# Patient Record
Sex: Female | Born: 1960 | Race: Black or African American | Hispanic: No | Marital: Single | Smoking: Current some day smoker
Health system: Southern US, Community
[De-identification: ages and names within clinical notes are randomized; demographics above are authoritative.]

## PROBLEM LIST (undated history)

## (undated) ENCOUNTER — Ambulatory Visit: Admission: EM | Source: Home / Self Care

## (undated) HISTORY — PX: ABDOMINAL HYSTERECTOMY: SHX81

---

## 2001-01-15 ENCOUNTER — Other Ambulatory Visit: Admission: RE | Admit: 2001-01-15 | Discharge: 2001-01-15 | Payer: Self-pay | Admitting: Gynecology

## 2002-04-20 ENCOUNTER — Other Ambulatory Visit: Admission: RE | Admit: 2002-04-20 | Discharge: 2002-04-20 | Payer: Self-pay | Admitting: Gynecology

## 2003-08-12 ENCOUNTER — Other Ambulatory Visit: Admission: RE | Admit: 2003-08-12 | Discharge: 2003-08-12 | Payer: Self-pay | Admitting: Gynecology

## 2005-03-12 ENCOUNTER — Other Ambulatory Visit: Admission: RE | Admit: 2005-03-12 | Discharge: 2005-03-12 | Payer: Self-pay | Admitting: Gynecology

## 2006-08-19 ENCOUNTER — Other Ambulatory Visit: Admission: RE | Admit: 2006-08-19 | Discharge: 2006-08-19 | Payer: Self-pay | Admitting: Gynecology

## 2007-11-03 ENCOUNTER — Ambulatory Visit (HOSPITAL_COMMUNITY): Admission: RE | Admit: 2007-11-03 | Discharge: 2007-11-03 | Payer: Self-pay | Admitting: Obstetrics & Gynecology

## 2008-07-06 ENCOUNTER — Encounter: Payer: Self-pay | Admitting: Obstetrics & Gynecology

## 2008-07-06 ENCOUNTER — Inpatient Hospital Stay (HOSPITAL_COMMUNITY): Admission: AD | Admit: 2008-07-06 | Discharge: 2008-07-07 | Payer: Self-pay | Admitting: Obstetrics & Gynecology

## 2008-10-31 IMAGING — CT CT ABD-PELV W/O CM
2 of 4 series · 15 of 42 positions shown, 19 images · non-contrast
Comparison: NONE

CLINICAL DATA: Dysfunctional uterine bleeding. 

CT ABDOMEN AND PELVIS WITHOUT INTRAVENOUS BUT FOLLOWING ORAL 
CONTRAST
TECHNIQUE: Multiple axial images were obtained from the diaphragm 
through the pelvis.

[Series 2: wo · axial · 0.61mm/px · z∈[+1351,+1691]mm · 12 of 77 slices shown, 16 images]
[im 5/77  soft-tissue]
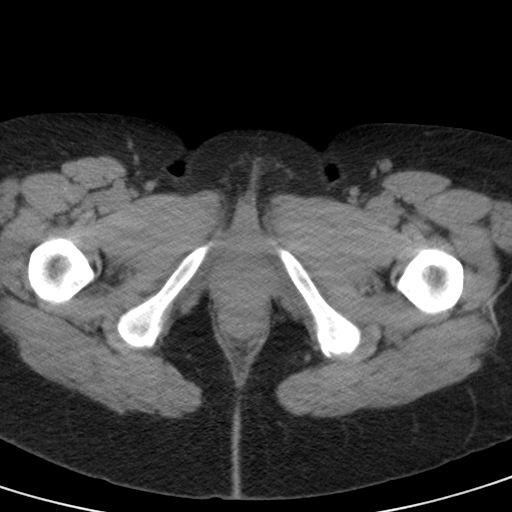
[im 5/77  bone]
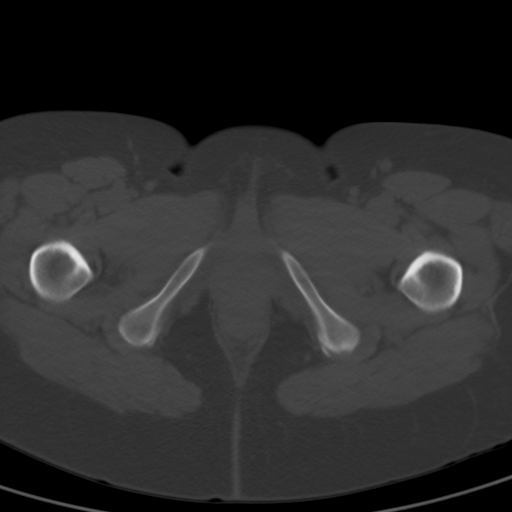
[im 13/77  soft-tissue]
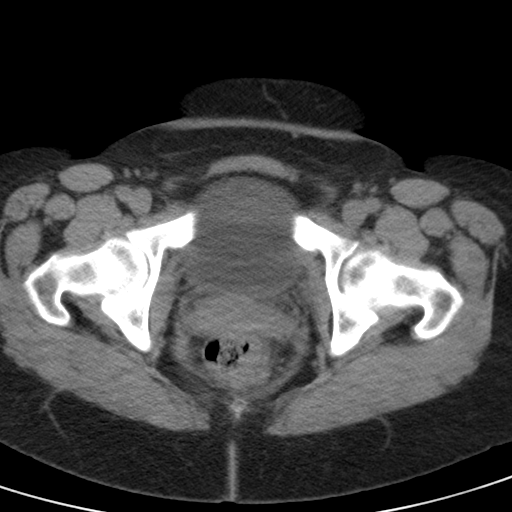
[im 21/77  soft-tissue]
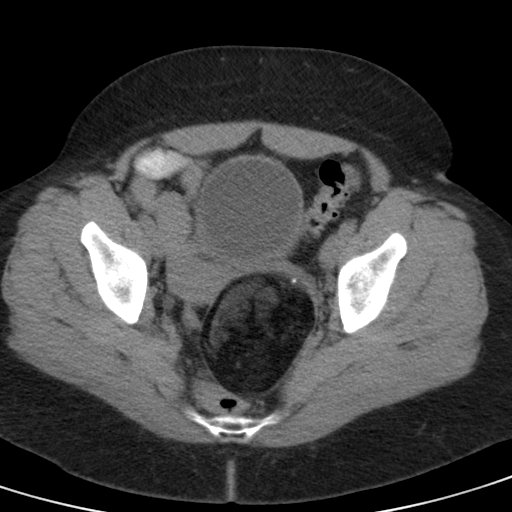
[im 29/77  soft-tissue]
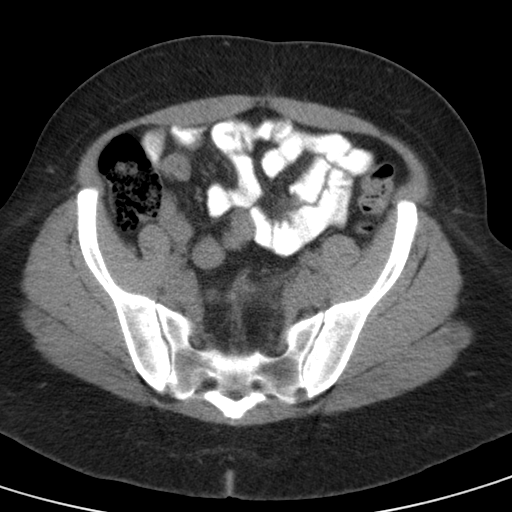
[im 37/77  soft-tissue]
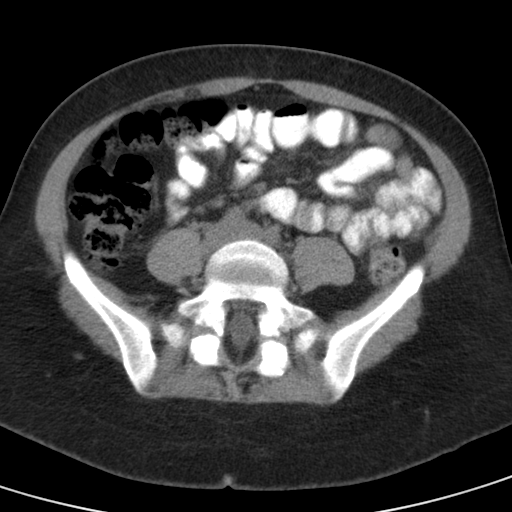
[im 41/77  soft-tissue]
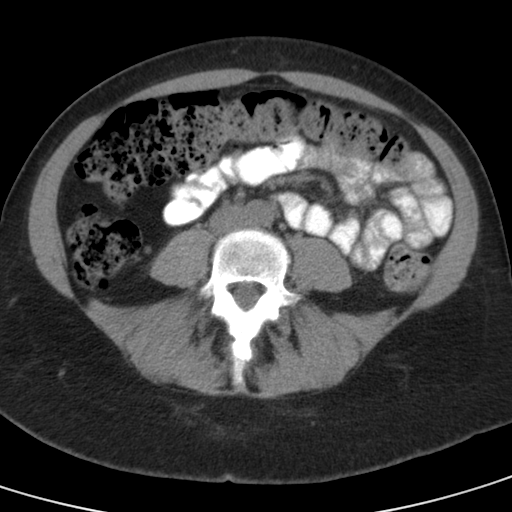
[im 49/77  soft-tissue]
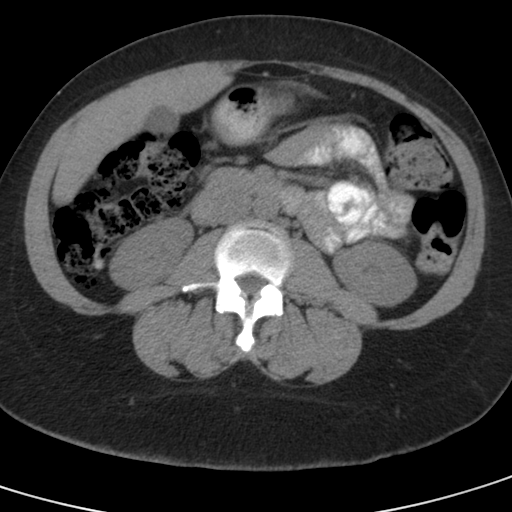
[im 57/77  soft-tissue]
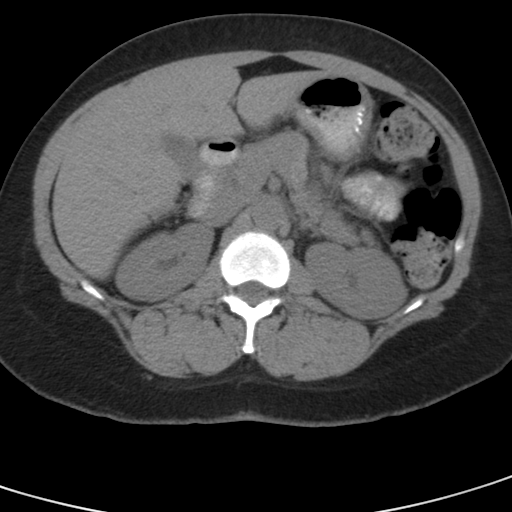
[im 61/77  lung]
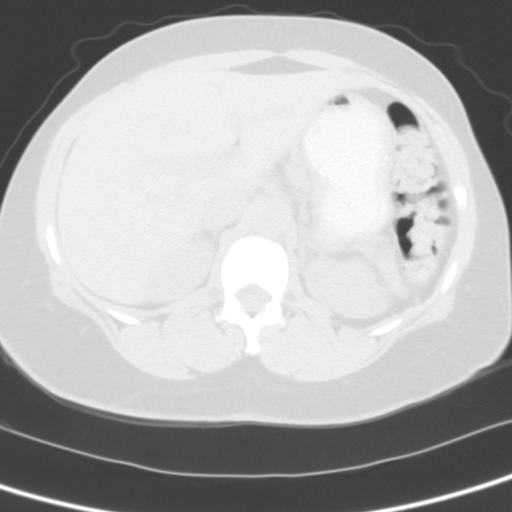
[im 65/77  soft-tissue]
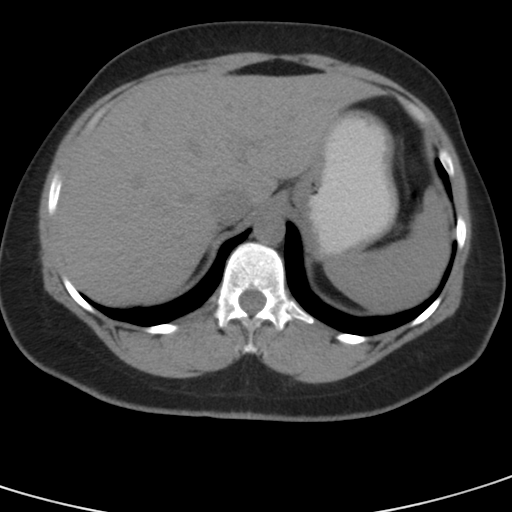
[im 65/77  lung]
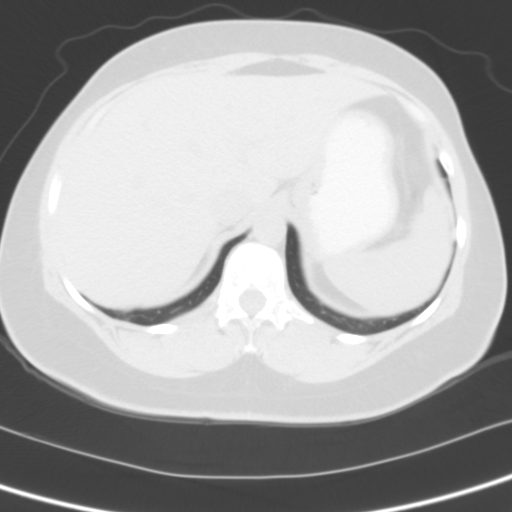
[im 65/77  bone]
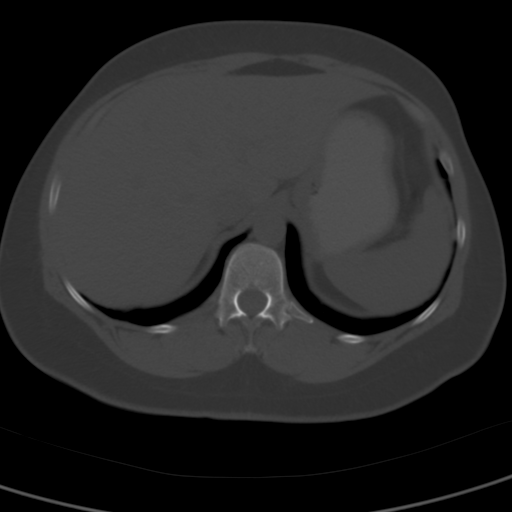
[im 69/77  lung]
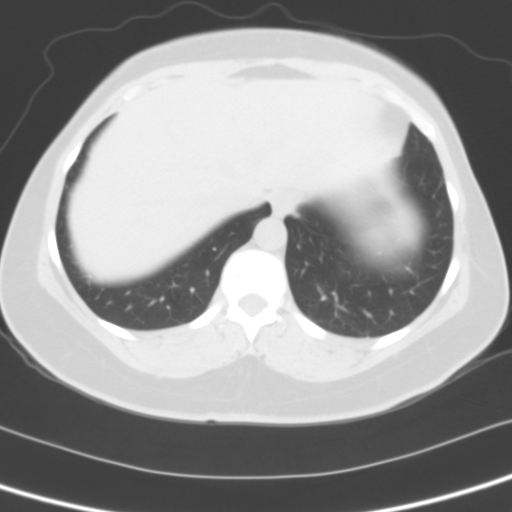
[im 73/77  soft-tissue]
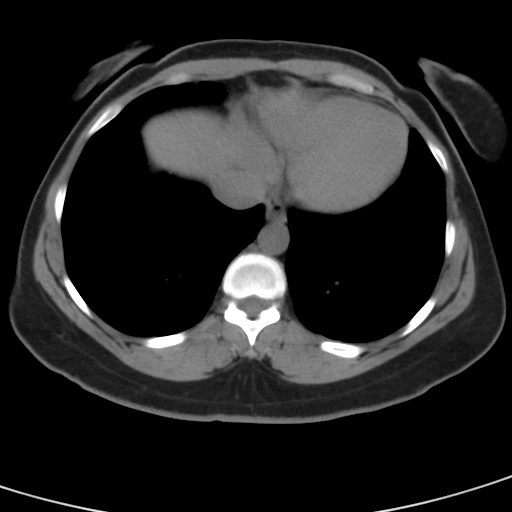
[im 73/77  lung]
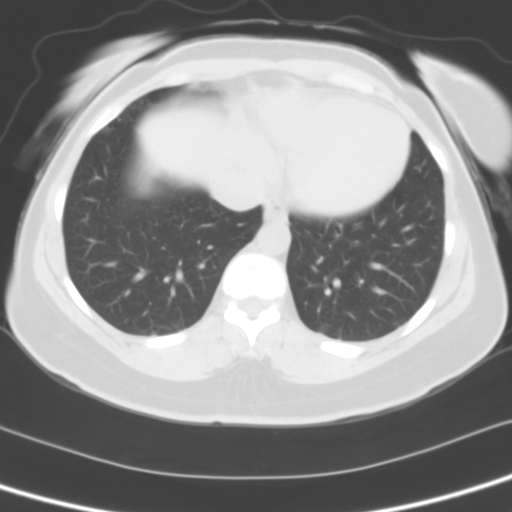

[coronals · coronal · 0.74mm/px · 3 of 75 slices shown]
[im 25/75  soft-tissue]
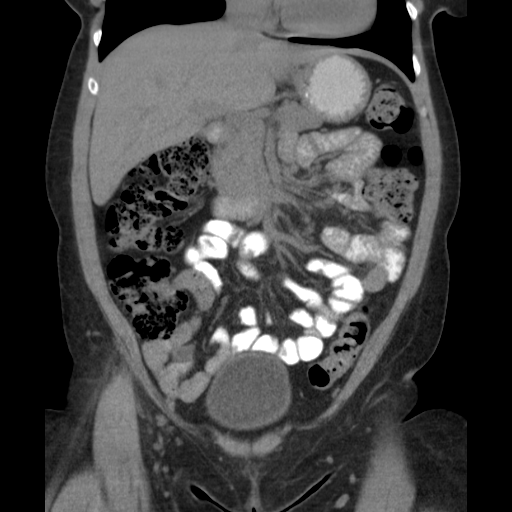
[im 33/75  soft-tissue]
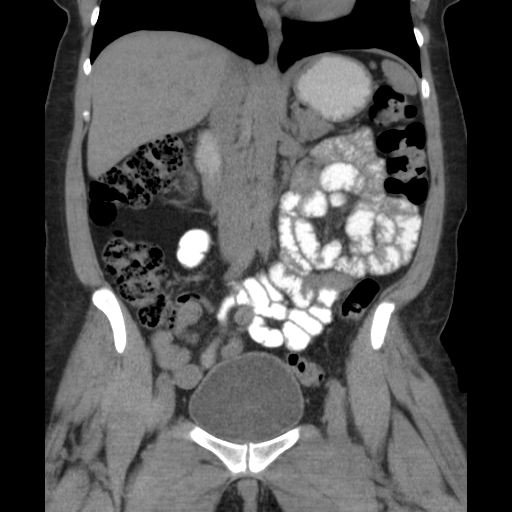
[im 42/75  soft-tissue]
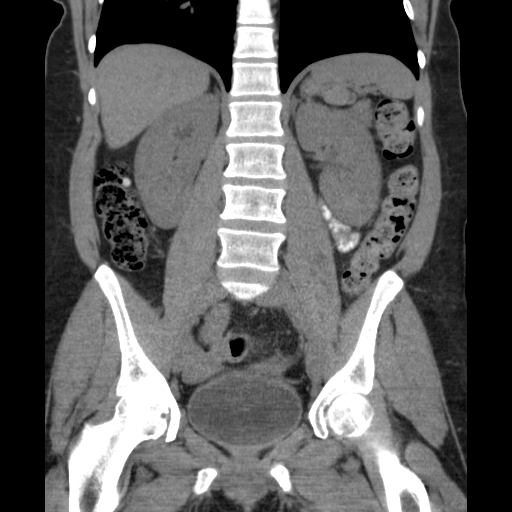

[15 of 42 positions shown; findings below may reference images not displayed]

FINDINGS: Intravenous contrast was not administered despite 
multiple attempts to gain intravenous access. There is a 
fat-density mass in the pelvis with a single focus of 
calcification in the wall or just adjacent to this mass.  The mass 
measures approximately 8.1x0.6 cm and most likely represents a 
taratoma. This displaces the uterus from left to right.  No free 
fluid or cul-de-sac masses are identified.  Liver, pancreas, 
spleen, kidneys, adrenal glands, and aorta appear unremarkable. No 
lung base mass, infiltrate, edema, or effusion.  No lytic or 
blastic lesions.
IMPRESSION: Large pelvic mass most compatible with a 
taratoma/dermoid tumor. Arceo, Westley electronically 
reviewed on 01/02/2008 Dict Date: 01/02/2008  Tran Date:  
01/02/2008 DAS  JLM

## 2009-05-07 IMAGING — CR DG CHEST 1V PORT
1 series · 1 of 1 positions shown · non-contrast
Comparison: None

CLINICAL DATA: Wheezing, postop abdominal surgery

PORTABLE CHEST - 1 VIEW

[view not recorded]
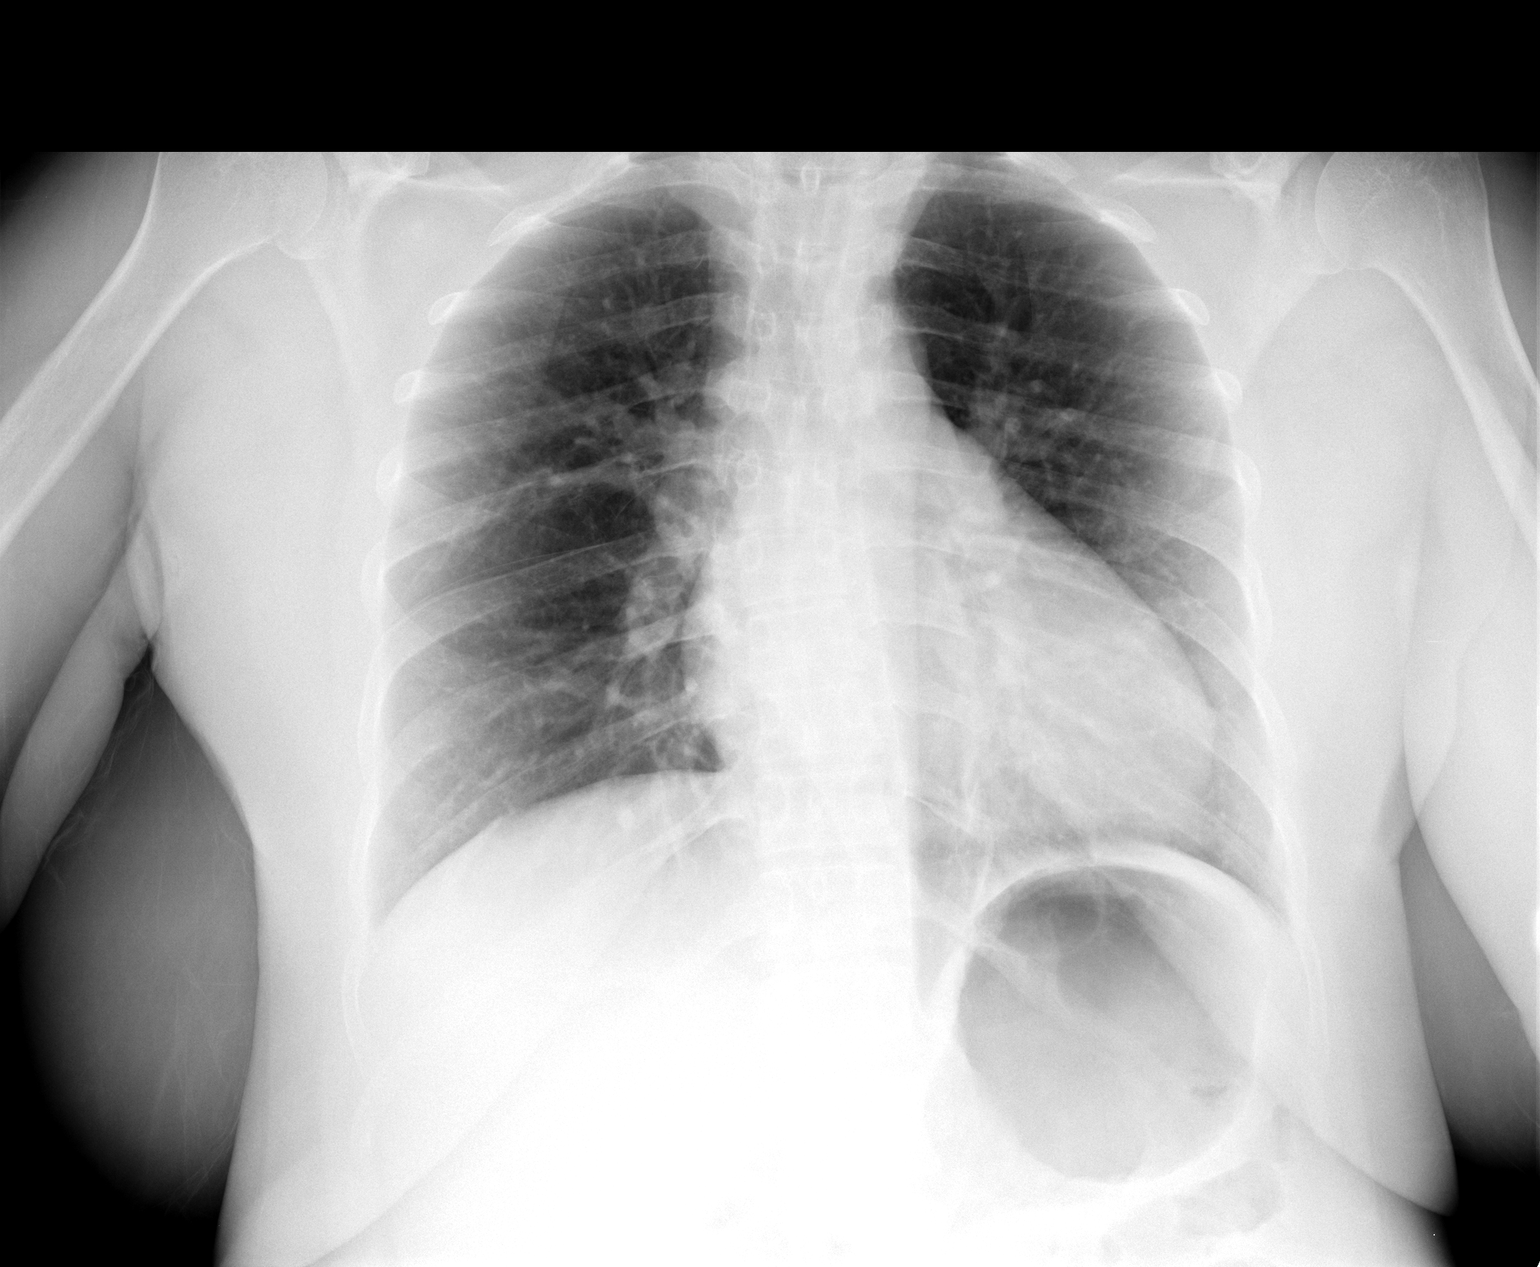

[1 of 1 positions shown; findings below may reference images not displayed]

FINDINGS: The lungs are clear.  The heart is within upper limits of
normal.  No bony abnormality is seen.
IMPRESSION: No active lung disease.

## 2010-12-19 NOTE — Op Note (Signed)
[name], SPAID NO.:  [id number]   MEDICAL RECORD NO.:  [id number]          PATIENT TYPE:  OIB   LOCATION:  9306                          FACILITY:  [facility]   PHYSICIAN:  Roseanna Rainbow, M.D.DATE OF BIRTH:  [birth year]   DATE OF PROCEDURE:  07/06/2008  DATE OF DISCHARGE:                               OPERATIVE REPORT   PREOPERATIVE DIAGNOSIS:  Pelvic mass.   POSTOPERATIVE DIAGNOSES:  1. Pelvic mass.  2. Left ovarian cyst.   PROCEDURE:  Exploratory laparotomy, left salpingo-oophorectomy.   SURGEON:  Roseanna Rainbow, MD   ASSISTANT:  Bing Neighbors. Clearance Coots, MD   ANESTHESIA:  General endotracheal.   FINDINGS:  Left ovarian cyst.  Normal right ovary and uterus.   PATHOLOGIES:  Left ovary and fallopian tubes.   ESTIMATED BLOOD LOSS:  Minimal.   COMPLICATIONS:  None.   PROCEDURE:  The patient was taken to the operating room with an IV  running.  She was given general anesthesia.  She was placed in the  dorsal supine position.  She was prepped and draped in usual sterile  fashion.  After a time-out had been completed, a Pfannenstiel skin  incision was then made with a scalpel and carried down to the underlying  fascia.  The fascia was nicked in the midline.  The fascial incision was  then extended.  The superior aspect of the fascial incision was tented  up and the underlying rectus muscles dissected off.  The inferior aspect  of the fascial incision was manipulated in a similar fashion.  The  rectus muscles were separated in the midline.  The parietal peritoneum  was tented up and entered sharply.  This incision was then extended  superiorly and inferiorly with good visualization of the bladder.  At  this point, a Control and instrumentation engineer was placed into the abdomen.  Washings were obtained for cytology.  The left ovary was then brought  into the incision.  The surface appeared smooth.  There were no  excrescences noted.  The cyst was  approximately 6 cm in diameter.  At  this point, the infundibulopelvic ligament was skeletonized and doubly  clamped and transected.  The fallopian tube and utero-ovarian ligament  was then clamped and the left ovary and tube were then excised.  The  pedicle was then secured with a suture ligature of 2-0 Vicryl.  Please  note that the infundibulopelvic ligament was secured with 2 free  ligatures of 2-0 Vicryl.  The pelvis was then irrigated.  There was  adequate hemostasis noted.  The peritoneal surfaces of the pelvis  appeared smooth as well as the uterus was normal appearing and the right  ovary and tube were normal appearing.  The right ovary appeared small  and atrophic.  Please note that prior to the left salpingo-oophorectomy,  the bowel was packed away with moistened laparotomy sponges and these  were then removed prior to closure of the parietal peritoneum.  The  parietal peritoneum was closed in a running fashion using a running  suture of 2-0 Vicryl.  The fascia was closed  with 2  running sutures of 0-Vicryl tied in the midline.  The skin was  closed in a subcuticular fashion using 3-0 Monocryl.  At the close of  the procedure, the instrument and pack counts were said to be correct  x2.  The patient was taken to the PACU awake and in stable condition.      Roseanna Rainbow, M.D.  Electronically Signed     LAJ/MEDQ  D:  07/07/2008  T:  07/07/2008  Job:  [id number]

## 2010-12-19 NOTE — H&P (Signed)
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   HISTORY OF PRESENT ILLNESS:  The patient had presented in March 2009  with an episode of abnormal bleeding.  The patient has been amenorrheic  for several years and felt to be menopausal.  An ultrasound in March  2009 showed a left ovarian mass consistent with a dermoid.  A subsequent  CT of the abdomen and pelvis showed a mass measuring approximately 8 x  7.5 cm with features suggestive of a benign cystic teratoma.  A CA-125  from November 2009 was 6.4.   PAST MEDICAL HISTORY:  No significant history of medical diseases.   PAST SURGICAL HISTORY:  Oral surgery.   SOCIAL HISTORY:  She is single.  She is a Naval architect.  Has no  significant smoking history.  Drinks a minimal amount of alcohol.  Denies illicit drug use.   FAMILY HISTORY:  1. Arthritis.  2. Alcoholism.  3. Adult-onset diabetes.  4. Hypertension.  5. COPD.  6. Congestive heart failure.  7. Emphysema.  8. Heart disease.  9. Myocardial infarction.   PAST GYNECOLOGICAL HISTORY:  Please see the above.  Menopausal symptoms  began at age 61.  She has a history of hormonal replacement therapy, but  she stopped taking it.  A Pap smear from March 2009 was negative.   REVIEW OF SYSTEMS:  Noncontributory.   PHYSICAL EXAMINATION:  VITAL SIGNS:  Temperature 98, pulse 67, blood  pressure 111/75, height 5 feet 3 inches, weight 174 pounds.  GENERAL:  Well developed, well nourished, no apparent distress.  LUNGS:  Clear to auscultation bilaterally.  HEART:  Regular rate and rhythm.  ABDOMEN:  Nontender.  No organomegaly.  PELVIC:  Normal EG, BUS.  On speculum exam, the vagina is clean.  The  cervix is without lesions.  Bimanual exam, there is some fullness, ill  defined in the left adnexa.  The uterus is anteverted, nontender, normal  size, and mobility.  The right adnexa is nonpalpable.   ASSESSMENT:  Postmenopausal patient with a pelvic mass.  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   PLAN:  The planned procedure is an exploratory laparotomy and salpingo-  oophorectomy.  The risks, benefits, and alternative forms of management  were reviewed with the patient including but not limited to an  alternative laparoscopic approach, infection, bleeding, and inadvertent  injury to the abdominal pelvic viscera.  Informed consent was obtained.      Roseanna Rainbow, M.D.  Electronically Signed     LAJ/MEDQ  D:  07/05/2008  T:  07/06/2008  Job:  [id number]

## 2011-05-11 LAB — CBC
HCT: 39.5 % (ref 36.0–46.0)
Hemoglobin: 13.3 g/dL (ref 12.0–15.0)
MCHC: 33 g/dL (ref 30.0–36.0)
MCHC: 33.6 g/dL (ref 30.0–36.0)
Platelets: 246 10*3/uL (ref 150–400)
Platelets: 273 10*3/uL (ref 150–400)
RDW: 13.5 % (ref 11.5–15.5)
RDW: 13.6 % (ref 11.5–15.5)

## 2011-05-11 LAB — BASIC METABOLIC PANEL
BUN: 5 mg/dL — ABNORMAL LOW (ref 6–23)
CO2: 29 mEq/L (ref 19–32)
Calcium: 9.3 mg/dL (ref 8.4–10.5)
Creatinine, Ser: 0.73 mg/dL (ref 0.4–1.2)
Glucose, Bld: 134 mg/dL — ABNORMAL HIGH (ref 70–99)

## 2011-07-24 ENCOUNTER — Other Ambulatory Visit: Payer: Self-pay | Admitting: Internal Medicine

## 2011-07-24 DIAGNOSIS — Z1231 Encounter for screening mammogram for malignant neoplasm of breast: Secondary | ICD-10-CM

## 2011-08-08 ENCOUNTER — Ambulatory Visit
Admission: RE | Admit: 2011-08-08 | Discharge: 2011-08-08 | Disposition: A | Discharge status: Home / Self Care | Payer: Self-pay | Source: Ambulatory Visit | Attending: Internal Medicine | Admitting: Internal Medicine

## 2011-08-08 DIAGNOSIS — Z1231 Encounter for screening mammogram for malignant neoplasm of breast: Secondary | ICD-10-CM

## 2012-11-11 ENCOUNTER — Ambulatory Visit (INDEPENDENT_AMBULATORY_CARE_PROVIDER_SITE_OTHER): Payer: BC Managed Care – PPO | Admitting: Family Medicine

## 2012-11-11 VITALS — BP 110/62 | HR 81 | Temp 98.0°F | Resp 18 | Wt 170.0 lb

## 2012-11-11 DIAGNOSIS — L209 Atopic dermatitis, unspecified: Secondary | ICD-10-CM

## 2012-11-11 DIAGNOSIS — L2089 Other atopic dermatitis: Secondary | ICD-10-CM

## 2012-11-11 LAB — POCT SKIN KOH: Skin KOH, POC: NEGATIVE

## 2012-11-11 MED ORDER — TRIAMCINOLONE 0.1 % CREAM:EUCERIN CREAM 1:1: 1.0000 "application " | TOPICAL_CREAM | Freq: Three times a day (TID) | CUTANEOUS | Status: DC | PRN

## 2012-11-11 NOTE — Patient Instructions (Signed)
General measures for eczema - avoidance of irritants or exacerbating factors is beneficial for most patients with eczema. General skin care measures aimed at reducing skin irritation and restoring the skin barrier include:  Using lukewarm water and soap-free cleansers to wash hands  Drying hands thoroughly after washing  Applying emollients (eg, petroleum jelly) immediately after hand drying and as often as possible  Wearing cotton gloves under vinyl or other nonlatex gloves when performing wet work  Removing rings and watches and bracelets before wet work  Wearing protective gloves in cold weather  Wearing task-specific gloves for frictional exposures (eg, gardening, carpentry)  Avoiding exposure to irritants (eg, detergents, solvents, hair lotions or dyes, acidic foods [eg, citrus fruit])  In clinical practice, astringent solutions such as aluminum subacetate (Burow's solution) or witch hazel are used for wet, weeping skin. Hands or feet are soaked in the solution for 15 minutes two to four times per day.  Eczema Atopic dermatitis, or eczema, is an inherited type of sensitive skin. Often people with eczema have a family history of allergies, asthma, or hay fever. It causes a red itchy rash and dry scaly skin. The itchiness may occur before the skin rash and may be very intense. It is not contagious. Eczema is generally worse during the cooler winter months and often improves with the warmth of summer. Eczema usually starts showing signs in infancy. Some children outgrow eczema, but it may last through adulthood. Flare-ups may be caused by:  Eating something or contact with something you are sensitive or allergic to.  Stress. DIAGNOSIS  The diagnosis of eczema is usually based upon symptoms and medical history. TREATMENT  Eczema cannot be cured, but symptoms usually can be controlled with treatment or avoidance of allergens (things to which you are sensitive or allergic to).  Controlling the  itching and scratching.  Use over-the-counter antihistamines as directed for itching. It is especially useful at night when the itching tends to be worse.  Use over-the-counter steroid creams as directed for itching.  Scratching makes the rash and itching worse and may cause impetigo (a skin infection) if fingernails are contaminated (dirty).  Keeping the skin well moisturized with creams every day. This will seal in moisture and help prevent dryness. Lotions containing alcohol and water can dry the skin and are not recommended.  Limiting exposure to allergens.  Recognizing situations that cause stress.  Developing a plan to manage stress. HOME CARE INSTRUCTIONS   Take prescription and over-the-counter medicines as directed by your caregiver.  Do not use anything on the skin without checking with your caregiver.  Keep baths or showers short (5 minutes) in warm (not hot) water. Use mild cleansers for bathing. You may add non-perfumed bath oil to the bath water. It is best to avoid soap and bubble bath.  Immediately after a bath or shower, when the skin is still damp, apply a moisturizing ointment to the entire body. This ointment should be a petroleum ointment. This will seal in moisture and help prevent dryness. The thicker the ointment the better. These should be unscented.  Keep fingernails cut short and wash hands often. If your child has eczema, it may be necessary to put soft gloves or mittens on your child at night.  Dress in clothes made of cotton or cotton blends. Dress lightly, as heat increases itching.  Avoid foods that may cause flare-ups. Common foods include cow's milk, peanut butter, eggs and wheat.  Keep a child with eczema away from anyone with  fever blisters. The virus that causes fever blisters (herpes simplex) can cause a serious skin infection in children with eczema. SEEK MEDICAL CARE IF:   Itching interferes with sleep.  The rash gets worse or is not better  within one week following treatment.  The rash looks infected (pus or soft yellow scabs).  You or your child has an oral temperature above 102 F (38.9 C).  Your baby is older than 3 months with a rectal temperature of 100.5 F (38.1 C) or higher for more than 1 day.  The rash flares up after contact with someone who has fever blisters. SEEK IMMEDIATE MEDICAL CARE IF:   Your baby is older than 3 months with a rectal temperature of 102 F (38.9 C) or higher.  Your baby is older than 3 months or younger with a rectal temperature of 100.4 F (38 C) or higher. Document Released: 07/20/2000 Document Revised: 10/15/2011 Document Reviewed: 05/25/2009 [facility] Patient Information 2013 [place], [place].

## 2012-11-11 NOTE — Progress Notes (Signed)
  Subjective:    Patient ID: Candice Griffin, female    DOB: December 04, 1960, 52 y.o.   MRN: [id number] Chief Complaint  Patient presents with  . Rash    all over    HPI  Developed a rash about 2 mos ago, tried otc medications like cortisone and aveno which helped but then the rash just comes back.  It is very itchy and it is spreading.  No known contacts. Had something like this when she was young - eczema.  Feels a lot better in the water or with lotion. It is worst under armpits, down sides, under breasts, across abdomen, between thighs and in inguinal folds, in olecranon and popliteal flexor fossas.  History reviewed. No pertinent past medical history. No current outpatient prescriptions on file prior to visit.   No current facility-administered medications on file prior to visit.   Allergies  Allergen Reactions  . Clindamycin/Lincomycin   . Codeine   . Penicillins      Review of Systems  Constitutional: Negative for fever, chills and diaphoresis.  Respiratory: Negative for cough, shortness of breath and wheezing.   Musculoskeletal: Negative for joint swelling and arthralgias.  Skin: Positive for color change and rash. Negative for pallor and wound.  Allergic/Immunologic: Negative for environmental allergies and food allergies.  Hematological: Negative for adenopathy. Does not bruise/bleed easily.      BP 110/62  Pulse 81  Temp(Src) 98 F (36.7 C) (Oral)  Resp 18  Wt 170 lb (77.111 kg) Objective:   Physical Exam  Constitutional: She is oriented to person, place, and time. She appears well-developed and well-nourished. No distress.  HENT:  Head: Normocephalic and atraumatic.  Right Ear: External ear normal.  Eyes: Conjunctivae are normal. No scleral icterus.  Pulmonary/Chest: Effort normal.  Neurological: She is alert and oriented to person, place, and time.  Skin: Skin is warm and dry. Rash noted. She is not diaphoretic.  Well defined oval lesions of varying sizes of  pencil eraser to >1 in dm of purplish plaque with slight scale on erythematous base most promenent on all warm flexor areas - axilla, olecranon fossa, sides, inguinal creases. None on back, some on abdomen.  Psychiatric: She has a normal mood and affect. Her behavior is normal.      Results for orders placed in visit on 11/11/12  POCT SKIN KOH      Result Value Range   Skin KOH, POC Negative      Assessment & Plan:  Atopic dermatitis - Plan: POCT Skin KOH, Triamcinolone Acetonide (TRIAMCINOLONE 0.1 % CREAM : EUCERIN) CREA - keep hydrated.  After rash goes away, cont to apply thick lotion twice a day, esp immed after bathing or showering to prevent recurrence.  If worsening, RTC.  Meds ordered this encounter  Medications  . aspirin 81 MG tablet    Sig: Take 81 mg by mouth daily.  . Triamcinolone Acetonide (TRIAMCINOLONE 0.1 % CREAM : EUCERIN) CREA    Sig: Apply 1 application topically 3 (three) times daily as needed.    Dispense:  2 each    Refill:  11   See pt instructions

## 2017-07-02 ENCOUNTER — Ambulatory Visit: Payer: BLUE CROSS/BLUE SHIELD | Admitting: Physician Assistant

## 2017-07-02 ENCOUNTER — Encounter: Payer: Self-pay | Admitting: Physician Assistant

## 2017-07-02 ENCOUNTER — Telehealth: Payer: Self-pay | Admitting: Physician Assistant

## 2017-07-02 VITALS — BP 128/84 | HR 60 | Resp 16 | Ht 63.0 in | Wt 207.2 lb

## 2017-07-02 DIAGNOSIS — B369 Superficial mycosis, unspecified: Secondary | ICD-10-CM | POA: Diagnosis not present

## 2017-07-02 DIAGNOSIS — Z23 Encounter for immunization: Secondary | ICD-10-CM | POA: Diagnosis not present

## 2017-07-02 DIAGNOSIS — L209 Atopic dermatitis, unspecified: Secondary | ICD-10-CM | POA: Diagnosis not present

## 2017-07-02 LAB — POCT SKIN KOH: SKIN KOH, POC: NEGATIVE

## 2017-07-02 MED ORDER — TRIAMCINOLONE 0.1 % CREAM:EUCERIN CREAM 1:1: 1.0000 "application " | TOPICAL_CREAM | Freq: Three times a day (TID) | CUTANEOUS | 11 refills | Status: DC | PRN

## 2017-07-02 MED ORDER — CLOTRIMAZOLE 1 % EX CREA: 1.0000 "application " | TOPICAL_CREAM | Freq: Two times a day (BID) | CUTANEOUS | 0 refills | Status: DC

## 2017-07-02 NOTE — Progress Notes (Signed)
    07/02/2017 2:51 PM   DOB: 05/02/1961 / MRN: [id number]  SUBJECTIVE:  Candice Griffin is a 56 y.o. female presenting for recurrence of rash between her breast. This has been present now for about 2 weeks and she has had two other recurrences in her lifetime, the last of which was improved after seeing Dr. Clelia CroftShaw and subsequently starting Triamcinolone.      She is allergic to clindamycin/lincomycin; codeine; and penicillins.   She  has no past medical history on file.    She  reports that she has quit smoking. she has never used smokeless tobacco. She reports that she drinks alcohol. She reports that she does not use drugs. She  reports that she does not engage in sexual activity. The patient  has a past surgical history that includes Abdominal hysterectomy.  Her family history includes Diabetes in her mother; Hypertension in her mother.  Review of Systems  Constitutional: Negative for chills, diaphoresis and fever.  Gastrointestinal: Negative for nausea.  Musculoskeletal: Negative for myalgias.  Skin: Negative for rash.  Neurological: Negative for dizziness.    The problem list and medications were reviewed and updated by myself where necessary and exist elsewhere in the encounter.   OBJECTIVE:  BP 128/84 (BP Location: Right Arm, Patient Position: Sitting, Cuff Size: Normal)   Pulse 60   Resp 16   Ht 5\' 3"  (1.6 m)   Wt 207 lb 3.2 oz (94 kg)   SpO2 98%   BMI 36.70 kg/m   Physical Exam  Constitutional: She is oriented to person, place, and time. No distress.  Cardiovascular: Normal rate.  Pulmonary/Chest: Effort normal.    Neurological: She is alert and oriented to person, place, and time.  Skin: She is not diaphoretic.    Results for orders placed or performed in visit on 07/02/17 (from the past 72 hour(s))  POCT Skin KOH     Status: None   Collection Time: 07/02/17  2:51 PM  Result Value Ref Range   Skin KOH, POC Negative Negative    No results  found.  ASSESSMENT AND PLAN:  Candice Griffin was seen today for rash.  Diagnoses and all orders for this visit:  Atopic dermatitis: Negative for fungus on KOH however strongly suspect candida intertrigo given location and appearance of the rash.  -     Triamcinolone Acetonide (TRIAMCINOLONE 0.1 % CREAM : EUCERIN) CREA; Apply 1 application topically 3 (three) times daily as needed for rash, itching or irritation. -     clotrimazole (LOTRIMIN) 1 % cream; Apply 1 application topically 2 (two) times daily. Apply with the triamcinolone. -     POCT Skin KOH  Need for influenza vaccination -     Flu Vaccine QUAD 36+ mos IM    The patient is advised to call or return to clinic if she does not see an improvement in symptoms, or to seek the care of the closest emergency department if she worsens with the above plan.   Candice Griffin, MHS, PA-C [facility] 07/02/2017 2:51 PM

## 2017-07-02 NOTE — Telephone Encounter (Signed)
Pharmacist   Has   questian   About   Triamcinolone   Compound   Strengths   Pharmacist  Kathlene NovemberMike     At  [facility]   Will  Fax  The  Request  To the  [facility]   [place]

## 2017-07-02 NOTE — Patient Instructions (Signed)
Apply the steroid ointment along with the fungal medication. Please come back anytime for an annual physical. Thank you for getting your flu shot today.

## 2018-09-06 DIAGNOSIS — R21 Rash and other nonspecific skin eruption: Secondary | ICD-10-CM | POA: Diagnosis not present

## 2018-09-06 DIAGNOSIS — B029 Zoster without complications: Secondary | ICD-10-CM | POA: Diagnosis not present

## 2018-10-09 DIAGNOSIS — R21 Rash and other nonspecific skin eruption: Secondary | ICD-10-CM | POA: Diagnosis not present

## 2018-10-09 DIAGNOSIS — B029 Zoster without complications: Secondary | ICD-10-CM | POA: Diagnosis not present

## 2019-12-16 ENCOUNTER — Encounter (HOSPITAL_COMMUNITY): Payer: Self-pay

## 2019-12-16 ENCOUNTER — Ambulatory Visit (HOSPITAL_COMMUNITY)
Admission: EM | Admit: 2019-12-16 | Discharge: 2019-12-16 | Disposition: A | Discharge status: Home / Self Care | Payer: 59 | Attending: Emergency Medicine | Admitting: Emergency Medicine

## 2019-12-16 ENCOUNTER — Other Ambulatory Visit: Payer: Self-pay

## 2019-12-16 DIAGNOSIS — Z23 Encounter for immunization: Secondary | ICD-10-CM

## 2019-12-16 DIAGNOSIS — M79645 Pain in left finger(s): Secondary | ICD-10-CM

## 2019-12-16 DIAGNOSIS — S61217A Laceration without foreign body of left little finger without damage to nail, initial encounter: Secondary | ICD-10-CM

## 2019-12-16 MED ORDER — MUPIROCIN 2 % EX OINT: 1.0000 "application " | TOPICAL_OINTMENT | Freq: Two times a day (BID) | CUTANEOUS | 0 refills | Status: DC

## 2019-12-16 MED ORDER — TETANUS-DIPHTH-ACELL PERTUSSIS 5-2.5-18.5 LF-MCG/0.5 IM SUSP
0.5000 mL | Freq: Once | INTRAMUSCULAR | Status: AC
  Administered 2019-12-16: 10:00:00 0.5 mL via INTRAMUSCULAR

## 2019-12-16 MED ORDER — TETANUS-DIPHTH-ACELL PERTUSSIS 5-2.5-18.5 LF-MCG/0.5 IM SUSP
INTRAMUSCULAR | Status: AC
  Filled 2019-12-16: qty 0.5

## 2019-12-16 MED ORDER — LIDOCAINE-EPINEPHRINE-TETRACAINE (LET) TOPICAL GEL
3.0000 mL | Freq: Once | TOPICAL | Status: AC
  Administered 2019-12-16: 3 mL via TOPICAL

## 2019-12-16 MED ORDER — SULFAMETHOXAZOLE-TRIMETHOPRIM 800-160 MG PO TABS: 1.0000 | ORAL_TABLET | Freq: Two times a day (BID) | ORAL | 0 refills | Status: AC

## 2019-12-16 MED ORDER — LIDOCAINE-EPINEPHRINE-TETRACAINE (LET) TOPICAL GEL
TOPICAL | Status: AC
  Filled 2019-12-16: qty 3

## 2019-12-16 MED ORDER — BACITRACIN ZINC 500 UNIT/GM EX OINT
TOPICAL_OINTMENT | CUTANEOUS | Status: AC
  Filled 2019-12-16: qty 0.9

## 2019-12-16 NOTE — ED Provider Notes (Signed)
[facility]   [id number] 12/16/19 Arrival Time: 0911   CC: Laceration  SUBJECTIVE:  Candice Griffin 59 y.o. female presents to the urgent care with a complaint of laceration  to the left little finger that occurred yesterday.  Symptoms began after she cut her finger.  Bleeding controlled.  Currently not on blood thinners.  Denies similar symptoms in the past.  Denies fever, chills, nausea, vomiting, redness, swelling, purulent drainage, decrease strength or sensation.  Tetanus Immunization status is unknown.   Review of Systems  Constitutional: Negative.   Respiratory: Negative.   Cardiovascular: Negative.   Skin: Positive for color change and wound.  All other systems reviewed and are negative.    History reviewed. No pertinent past medical history. Past Surgical History:  Procedure Laterality Date  . ABDOMINAL HYSTERECTOMY     Allergies  Allergen Reactions  . Clindamycin/Lincomycin   . Codeine   . Penicillins    No current facility-administered medications on file prior to encounter.   Current Outpatient Medications on File Prior to Encounter  Medication Sig Dispense Refill  . aspirin 81 MG tablet Take 81 mg by mouth daily.    . clotrimazole (LOTRIMIN) 1 % cream Apply 1 application topically 2 (two) times daily. Apply with the triamcinolone. 60 g 0  . Triamcinolone Acetonide (TRIAMCINOLONE 0.1 % CREAM : EUCERIN) CREA Apply 1 application topically 3 (three) times daily as needed for rash, itching or irritation. 2 each 11   Social History   Socioeconomic History  . Marital status: Single    Spouse name: Not on file  . Number of children: Not on file  . Years of education: Not on file  . Highest education level: Not on file  Occupational History  . Not on file  Tobacco Use  . Smoking status: Former Games developer  . Smokeless tobacco: Never Used  Substance and Sexual Activity  . Alcohol use: Yes  . Drug use: No  . Sexual activity: Never    Birth  control/protection: Abstinence  Other Topics Concern  . Not on file  Social History Narrative  . Not on file   Social Determinants of Health   Financial Resource Strain:   . Difficulty of Paying Living Expenses:   Food Insecurity:   . Worried About Programme researcher, broadcasting/film/video in the Last Year:   . Barista in the Last Year:   Transportation Needs:   . Freight forwarder (Medical):   Marland Kitchen Lack of Transportation (Non-Medical):   Physical Activity:   . Days of Exercise per Week:   . Minutes of Exercise per Session:   Stress:   . Feeling of Stress :   Social Connections:   . Frequency of Communication with Friends and Family:   . Frequency of Social Gatherings with Friends and Family:   . Attends Religious Services:   . Active Member of Clubs or Organizations:   . Attends Banker Meetings:   Marland Kitchen Marital Status:   Intimate Partner Violence:   . Fear of Current or Ex-Partner:   . Emotionally Abused:   Marland Kitchen Physically Abused:   . Sexually Abused:    Family History  Problem Relation Age of Onset  . Diabetes Mother   . Hypertension Mother     OBJECTIVE:  Vitals:   12/16/19 0924  BP: 134/64  Pulse: 79  Resp: 14  Temp: 98.1 F (36.7 C)  TempSrc: Oral  SpO2: 100%     Physical Exam Vitals and nursing  note reviewed.  Constitutional:      General: She is not in acute distress.    Appearance: Normal appearance. She is normal weight. She is not ill-appearing, toxic-appearing or diaphoretic.  Cardiovascular:     Rate and Rhythm: Normal rate and regular rhythm.     Pulses: Normal pulses.     Heart sounds: Normal heart sounds. No murmur. No friction rub. No gallop.   Pulmonary:     Effort: Pulmonary effort is normal. No respiratory distress.     Breath sounds: Normal breath sounds. No stridor. No wheezing, rhonchi or rales.  Chest:     Chest wall: No tenderness.  Skin:    General: Skin is warm.     Coloration: Skin is not pale.     Findings: Erythema and  laceration present.     Nails: There is no clubbing.     Comments: Laceration to left little finger with tissue avulsion at the tip of the finger.  Neurological:     Mental Status: She is alert.      ASSESSMENT & PLAN:  1. Pain in left finger(s)   2. Laceration of left little finger without foreign body without damage to nail, initial encounter     Meds ordered this encounter  Medications  . Tdap (BOOSTRIX) injection 0.5 mL  . sulfamethoxazole-trimethoprim (BACTRIM DS) 800-160 MG tablet    Sig: Take 1 tablet by mouth 2 (two) times daily for 7 days.    Dispense:  14 tablet    Refill:  0  . mupirocin ointment (BACTROBAN) 2 %    Sig: Apply 1 application topically 2 (two) times daily.    Dispense:  22 g    Refill:  0  . lidocaine-EPINEPHrine-tetracaine (LET) topical gel    Discharge instruction Wash site daily with warm water and mild soap Apply thin layer of Bactroban Keep it covered  Take antibiotics as prescribed and to completion Follow-up with PCP Return or go to ED if you have any new or worsening symptoms such as increased redness, swelling, pain, nausea, vomiting, fever, chills, pus, etc...  Reviewed expectations re: course of current medical issues. Questions answered. Outlined signs and symptoms indicating need for more acute intervention. Patient verbalized understanding. After Visit Summary given.          Emerson Monte, FNP 12/16/19 1018

## 2019-12-16 NOTE — Discharge Instructions (Addendum)
Wash site daily with warm water and mild soap Apply thin layer of Bactroban Keep  it covered  Take antibiotics as prescribed and to completion Follow-up with PCP Return or go to ED if you have any new or worsening symptoms such as increased redness, swelling, pain, nausea, vomiting, fever, chills, pus, etc..Marland Kitchen

## 2019-12-16 NOTE — ED Triage Notes (Signed)
C/o laceration to left pinky finger, occurred yesterday around 1700.

## 2023-05-04 ENCOUNTER — Encounter (HOSPITAL_COMMUNITY): Payer: Self-pay

## 2023-05-04 ENCOUNTER — Ambulatory Visit (HOSPITAL_COMMUNITY)
Admission: EM | Admit: 2023-05-04 | Discharge: 2023-05-04 | Disposition: A | Discharge status: Home / Self Care | Payer: BC Managed Care – PPO | Attending: Emergency Medicine | Admitting: Emergency Medicine

## 2023-05-04 DIAGNOSIS — L139 Bullous disorder, unspecified: Secondary | ICD-10-CM | POA: Diagnosis not present

## 2023-05-04 DIAGNOSIS — L299 Pruritus, unspecified: Secondary | ICD-10-CM

## 2023-05-04 DIAGNOSIS — L03116 Cellulitis of left lower limb: Secondary | ICD-10-CM

## 2023-05-04 MED ORDER — TRIAMCINOLONE ACETONIDE 0.1 % EX CREA: 1.0000 | TOPICAL_CREAM | Freq: Two times a day (BID) | CUTANEOUS | 0 refills | Status: DC

## 2023-05-04 MED ORDER — BETAMETHASONE DIPROPIONATE 0.05 % EX OINT: TOPICAL_OINTMENT | Freq: Two times a day (BID) | CUTANEOUS | 0 refills | Status: DC

## 2023-05-04 MED ORDER — HYDROXYZINE HCL 25 MG PO TABS: 25.0000 mg | ORAL_TABLET | Freq: Four times a day (QID) | ORAL | 0 refills | Status: DC

## 2023-05-04 MED ORDER — SULFAMETHOXAZOLE-TRIMETHOPRIM 800-160 MG PO TABS: 1.0000 | ORAL_TABLET | Freq: Two times a day (BID) | ORAL | 0 refills | Status: AC

## 2023-05-04 MED ORDER — GABAPENTIN 100 MG PO CAPS: 100.0000 mg | ORAL_CAPSULE | Freq: Three times a day (TID) | ORAL | 0 refills | Status: DC | PRN

## 2023-05-04 NOTE — ED Triage Notes (Signed)
Patient here today with c/o blistering, itchy, and burning rash on left ankle X 1 week. She has been using Neosporin to some of the broken blisters. She has a h/o eczema.

## 2023-05-04 NOTE — Discharge Instructions (Addendum)
Hydroxyzine for itching.  May cause drowsiness Gabapentin for burning pain. Bactrim antibiotic Betamethasone for topical irritation. Clean area with mild soap and water, allow to air dry and let blisters pop on their own to avoid further risk of infection. Follow up with PCP is no improvement.

## 2023-05-04 NOTE — ED Provider Notes (Signed)
[facility]    CSN: [id number] Arrival date & time: 05/04/23  1045      History   Chief Complaint Chief Complaint  Patient presents with   Rash    HPI Candice Griffin is a 62 y.o. female.  Patient is reporting blister on lower leg x 1 week.  It was getting better and then has worsened.  Area is extremely itchy with burning pain.   Rash   History reviewed. No pertinent past medical history.  There are no problems to display for this patient.   Past Surgical History:  Procedure Laterality Date   ABDOMINAL HYSTERECTOMY      OB History   No obstetric history on file.      Home Medications    Prior to Admission medications   Medication Sig Start Date End Date Taking? Authorizing Provider  betamethasone dipropionate (DIPROLENE) 0.05 % ointment Apply topically 2 (two) times daily. 05/04/23  Yes Derrin Currey, Linde Gillis, NP  gabapentin (NEURONTIN) 100 MG capsule Take 1 capsule (100 mg total) by mouth 3 (three) times daily as needed (For burning pain). 05/04/23  Yes Kirstin Kugler, Linde Gillis, NP  hydrOXYzine (ATARAX) 25 MG tablet Take 1 tablet (25 mg total) by mouth every 6 (six) hours. 05/04/23  Yes Milfred Krammes, Linde Gillis, NP  sulfamethoxazole-trimethoprim (BACTRIM DS) 800-160 MG tablet Take 1 tablet by mouth 2 (two) times daily for 5 days. 05/04/23 05/09/23 Yes Jaide Hillenburg, Linde Gillis, NP  aspirin 81 MG tablet Take 81 mg by mouth daily.    [provider]  clotrimazole (LOTRIMIN) 1 % cream Apply 1 application topically 2 (two) times daily. Apply with the triamcinolone. 07/02/17   Ofilia Neas, PA-C  mupirocin ointment (BACTROBAN) 2 % Apply 1 application topically 2 (two) times daily. 12/16/19   Avegno, Zachery Dakins, FNP  Triamcinolone Acetonide (TRIAMCINOLONE 0.1 % CREAM : EUCERIN) CREA Apply 1 application topically 3 (three) times daily as needed for rash, itching or irritation. 07/02/17   Ofilia Neas, PA-C    Family History Family History  Problem Relation Age of Onset    Diabetes Mother    Hypertension Mother     Social History Social History   Tobacco Use   Smoking status: Some Days    Types: Cigarettes   Smokeless tobacco: Never  Vaping Use   Vaping status: Never Used  Substance Use Topics   Alcohol use: Yes   Drug use: No     Allergies   Clindamycin/lincomycin, Codeine, and Penicillins   Review of Systems Review of Systems  Skin:  Positive for rash.  All other systems reviewed and are negative.    Physical Exam Triage Vital Signs ED Triage Vitals  Encounter Vitals Group     BP 05/04/23 1100 120/77     Systolic BP Percentile --      Diastolic BP Percentile --      Pulse Rate 05/04/23 1100 69     Resp 05/04/23 1100 16     Temp 05/04/23 1100 98.3 F (36.8 C)     Temp Source 05/04/23 1100 Oral     SpO2 05/04/23 1100 95 %     Weight 05/04/23 1100 200 lb (90.7 kg)     Height 05/04/23 1100 5\' 2"  (1.575 m)     Head Circumference --      Peak Flow --      Pain Score 05/04/23 1059 0     Pain Loc --      Pain Education --  Exclude from Growth Chart --    No data found.  Updated Vital Signs BP 120/77 (BP Location: Right Arm)   Pulse 69   Temp 98.3 F (36.8 C) (Oral)   Resp 16   Ht 5\' 2"  (1.575 m)   Wt 200 lb (90.7 kg)   SpO2 95%   BMI 36.58 kg/m   Visual Acuity Right Eye Distance:   Left Eye Distance:   Bilateral Distance:    Right Eye Near:   Left Eye Near:    Bilateral Near:     Physical Exam Vitals and nursing note reviewed.  Constitutional:      Appearance: Normal appearance.  HENT:     Head: Normocephalic.     Nose: Nose normal.     Mouth/Throat:     Mouth: Mucous membranes are moist.     Pharynx: Oropharynx is clear.  Eyes:     Pupils: Pupils are equal, round, and reactive to light.  Cardiovascular:     Rate and Rhythm: Normal rate and regular rhythm.     Pulses: Normal pulses.  Musculoskeletal:        General: Normal range of motion.     Comments: Denies any unusual joint pain or swelling.   Skin:    General: Skin is warm.     Findings: Rash present.     Comments: Left medial leg with large bullae is measuring 3 cm x 2 cm there are 2 additional blisters measuring 1 cm with with a third measuring 0.5 cm x 0.5 cm.  Leg is warm discolored.  Neurological:     General: No focal deficit present.     Mental Status: She is alert and oriented to person, place, and time.       UC Treatments / Results  Labs (all labs ordered are listed, but only abnormal results are displayed) Labs Reviewed - No data to display  EKG   Radiology No results found.  Procedures Procedures (including critical care time)  Medications Ordered in UC Medications - No data to display  Initial Impression / Assessment and Plan / UC Course  I have reviewed the triage vital signs and the nursing notes.  Pertinent labs & imaging results that were available during my care of the patient were reviewed by me and considered in my medical decision making (see chart for details).  Skin irritation on left medial lower leg/ankle x 1 week.  Patient has denied any contact with grass, poison ivy, poison oak, or other possible irritants.  She has not been on any new meds, she has had no new vaccines, and no history of previous blister.  She does have history of eczema but this is not similar.  1.  Bullous dermatitis.  Will treat with betamethasone. 2.  Cellulitis.  We will treat with Bactrim. 3.  We will treat her itching and burning pain with gabapentin and hydroxyzine.  Final Clinical Impressions(s) / UC Diagnoses   Final diagnoses:  Bullous dermatitis  Cellulitis of left lower extremity  Itching with irritation     Discharge Instructions      Hydroxyzine for itching.  May cause drowsiness Gabapentin for burning pain. Bactrim antibiotic Betamethasone for topical irritation. Clean area with mild soap and water, allow to air dry and let blisters pop on their own to avoid further risk of  infection. Follow up with PCP is no improvement.      ED Prescriptions     Medication Sig Dispense Auth. Provider  hydrOXYzine (ATARAX) 25 MG tablet Take 1 tablet (25 mg total) by mouth every 6 (six) hours. 12 tablet Jaelani Posa, Linde Gillis, NP   triamcinolone cream (KENALOG) 0.1 %  (Status: Discontinued) Apply 1 Application topically 2 (two) times daily. 30 g Rena Sweeden M, NP   sulfamethoxazole-trimethoprim (BACTRIM DS) 800-160 MG tablet Take 1 tablet by mouth 2 (two) times daily for 5 days. 10 tablet Daiya Tamer, Linde Gillis, NP   gabapentin (NEURONTIN) 100 MG capsule Take 1 capsule (100 mg total) by mouth 3 (three) times daily as needed (For burning pain). 30 capsule Jarrett Albor, Linde Gillis, NP   betamethasone dipropionate (DIPROLENE) 0.05 % ointment Apply topically 2 (two) times daily. 30 g Kenith Trickel, Linde Gillis, NP      PDMP not reviewed this encounter.   Nelda Marseille, NP 05/04/23 1220

## 2024-05-27 ENCOUNTER — Telehealth: Payer: Self-pay

## 2024-05-27 NOTE — Telephone Encounter (Signed)
 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 There was no answer to my phone call, left her a voicemail messaging stating that if she needed cancel her appointment if it involved CDL paperwork and seek another office to have it completed.

## 2024-05-28 ENCOUNTER — Ambulatory Visit: Admitting: Family Medicine

## 2024-05-28 NOTE — Telephone Encounter (Signed)
 Thank you for following up.

## 2024-06-04 ENCOUNTER — Encounter: Payer: Self-pay | Admitting: Family Medicine

## 2024-06-04 ENCOUNTER — Ambulatory Visit (INDEPENDENT_AMBULATORY_CARE_PROVIDER_SITE_OTHER): Payer: Self-pay | Admitting: Family Medicine

## 2024-06-04 VITALS — BP 114/72 | HR 81 | Ht 62.0 in

## 2024-06-04 DIAGNOSIS — M2170 Unequal limb length (acquired), unspecified site: Secondary | ICD-10-CM

## 2024-06-04 DIAGNOSIS — R269 Unspecified abnormalities of gait and mobility: Secondary | ICD-10-CM

## 2024-06-04 NOTE — Progress Notes (Unsigned)
 New Patient Office Visit  Patient ID: Candice Griffin, Female   DOB: 10-24-1960 63 y.o. MRN: [id number]  Chief Complaint  Patient presents with   Establish Care    Leg swelling in both legs, going on for months, hard to walk   Subjective:     Candice Griffin presents to establish care  HPI  Discussed the use of AI scribe software for clinical note transcription with the patient, who gave verbal consent to proceed.  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  She has experienced progressive lower extremity mobility issues since the age of 47. Initially, pain was localized to the right thigh but has significantly subsided over time. She describes a leg length discrepancy, with one leg appearing longer than the other, which she believes developed over time rather than being congenital.  She has difficulty walking and standing straight, often leaning towards her right side. 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 She uses a stick to aid her balance and prevent falls, especially on uneven surfaces.  She previously consulted with [facility] and underwent physical therapy within the last two years. Hip surgery was suggested, but she did not understand the rationale as she perceived the issue to be with her knee. She reports that her right thigh is larger and heavier, and she feels like she drags it.  She has not had a primary care physician recently, as her previous doctor retired, and she typically visited occupational centers for medical needs. She is currently in between jobs and does not have insurance, opting to pay for medical expenses out of pocket.  No swelling in her legs, but she confirms issues with mobility and  balance.   Outpatient Encounter Medications as of 06/04/2024  Medication Sig   aspirin 81 MG tablet Take 81 mg by mouth daily.   betamethasone  dipropionate (DIPROLENE ) 0.05 % ointment Apply topically 2 (two) times daily. (Patient not taking: Reported on 06/04/2024)   clotrimazole  (LOTRIMIN ) 1 % cream Apply 1 application topically 2 (two) times daily. Apply with the triamcinolone . (Patient not taking: Reported on 06/04/2024)   gabapentin  (NEURONTIN ) 100 MG capsule Take 1 capsule (100 mg total) by mouth 3 (three) times daily as needed (For burning pain). (Patient not taking: Reported on 06/04/2024)   hydrOXYzine  (ATARAX ) 25 MG tablet Take 1 tablet (25 mg total) by mouth every 6 (six) hours. (Patient not taking: Reported on 06/04/2024)   mupirocin  ointment (BACTROBAN ) 2 % Apply 1 application topically 2 (two) times daily. (Patient not taking: Reported on 06/04/2024)   Triamcinolone  Acetonide (TRIAMCINOLONE  0.1 % CREAM : EUCERIN) CREA Apply 1 application topically 3 (three) times daily as needed for rash, itching or irritation. (Patient not taking: Reported on 06/04/2024)   No facility-administered encounter medications on file as of 06/04/2024.    History reviewed. No pertinent past medical history.  Past Surgical History:  Procedure Laterality Date   ABDOMINAL HYSTERECTOMY      Family History  Problem Relation Age of Onset   Diabetes Mother    Hypertension Mother     Social History   Socioeconomic History   Marital status: Single    Spouse name: Not on file   Number of children: Not on  file   Years of education: Not on file   Highest education level: Not on file  Occupational History   Not on file  Tobacco Use   Smoking status: Some Days    Types: Cigarettes   Smokeless tobacco: Never  Vaping Use   Vaping status: Never Used  Substance and Sexual Activity   Alcohol use: Yes   Drug use: No   Sexual activity: Never    Birth control/protection: Abstinence  Other Topics  Concern   Not on file  Social History Narrative   Not on file   Social Drivers of Health   Financial Resource Strain: Not on file  Food Insecurity: No Food Insecurity (06/04/2024)   Hunger Vital Sign    Worried About Running Out of Food in the Last Year: Never true    Ran Out of Food in the Last Year: Never true  Transportation Needs: No Transportation Needs (06/04/2024)   PRAPARE - Administrator, Civil Service (Medical): No    Lack of Transportation (Non-Medical): No  Physical Activity: Not on file  Stress: Not on file  Social Connections: Not on file  Intimate Partner Violence: Not At Risk (06/04/2024)   Humiliation, Afraid, Rape, and Kick questionnaire    Fear of Current or Ex-Partner: No    Emotionally Abused: No    Physically Abused: No    Sexually Abused: No    ROS    Objective:    BP 114/72   Pulse 81   Ht 5' 2 (1.575 m)   SpO2 97%   BMI 36.58 kg/m   Physical Exam {PhysExam [facility] (Optional):210964309}  {Labs (Optional):23779}    Assessment & Plan:   Problem List Items Addressed This Visit   None Visit Diagnoses       Gait abnormality    -  Primary       Assessment and Plan Assessment & Plan Abnormal gait and mobility with leg length discrepancy and right thigh muscle hypertrophy Chronic abnormal gait and mobility due to leg length discrepancy and right thigh muscle hypertrophy. Symptoms have progressed over the past decade, with increased difficulty in ambulation and maintaining posture. Right thigh muscle hypertrophy results from compensatory mechanisms due to uneven weight distribution. Previous orthopedic evaluation suggested hip surgery, but she seeks non-surgical management. Increased fall risk due to mobility issues. No leg swelling, and hypertrophy is not due to fluid accumulation. - Refer to orthopedic specialist for further evaluation and management of leg length discrepancy and mobility issues. - Refer to occupational  therapy for assistance with mobility and adaptation to the condition.  General Health Maintenance Discussion on general health maintenance, including the need for lab work to assess diabetes and cholesterol levels, and scheduling routine screenings such as mammogram and colonoscopy. She is currently uninsured and prefers to wait for coverage before proceeding with these evaluations. - Schedule lab work to assess diabetes and cholesterol levels once insurance is obtained. - Schedule mammogram and colonoscopy screenings once insurance is obtained.    No follow-ups on file.   Vinary K Xzavien Harada, MD [facility] at [facility]

## 2024-06-10 ENCOUNTER — Ambulatory Visit (INDEPENDENT_AMBULATORY_CARE_PROVIDER_SITE_OTHER): Payer: Self-pay | Admitting: Podiatry

## 2024-06-10 DIAGNOSIS — M2170 Unequal limb length (acquired), unspecified site: Secondary | ICD-10-CM

## 2024-06-10 NOTE — Progress Notes (Signed)
 Patient presents in referral from her primary doctor with a complaint of a leg length discrepancy.  She been having pain in the upper leg and around the hip and wonders if this could be caused by the leg length discrepancy.  No pain in the foot itself bilaterally.   Physical exam:  General appearance: Pleasant, and in no acute distress. AOx3.  Vascular: Pedal pulses: DP 2/4 bilaterally, PT 2/4 bilaterally. Mild edema lower legs bilaterally. Capillary fill time immediate bilaterally  Neurological: Light touch intact feet bilaterally.  Normal Achilles reflex bilaterally.  No clonus or spasticity noted.   Dermatologic:   Skin normal temperature bilaterally.  Skin normal color, tone, and texture bilaterally.   Musculoskeletal: Normal range of motion subtalar joint and ankle joint.  Measured from the distal tip of the medial malleolus there is a 15 mm leg length discrepancy right longer than the left.  Normal muscle strength lower extremity.   Diagnosis: 1.  Leg length discrepancy right greater than left  Plan: -New patient office visit for evaluation and management level 3 -Dispensed discussed the likely this discrepancy.  This could be causing some of the pain in the left leg indicating between the knee and the hip which could be from compensating for this.  I will try a left in the left shoe and see how she responds with this.  I explained that usually it is best to try to go with something that says transferral more from shoe or shoe otherwise yet to modify of her shoe.  Discussed that she persisted also probably seeing orthopedist about the hip as some of the pain she is getting down in the upper leg could be coming from coming from the hip or in the knee. -Dispensed quarter inch felt heel lift.  Explained that we will see how she does with this we can add another layer to thick and then if we need to in the future.   Return prn

## 2024-06-12 ENCOUNTER — Other Ambulatory Visit: Payer: Self-pay | Admitting: Family Medicine

## 2024-06-12 DIAGNOSIS — R269 Unspecified abnormalities of gait and mobility: Secondary | ICD-10-CM

## 2024-06-12 DIAGNOSIS — M2170 Unequal limb length (acquired), unspecified site: Secondary | ICD-10-CM

## 2024-06-24 ENCOUNTER — Encounter: Payer: Self-pay | Admitting: Family Medicine

## 2024-06-24 NOTE — Progress Notes (Signed)
 Do not charge for this visit.  Please see letter in chart for documentation about today's visit with patient. NO CHARGE per Dr Sol. 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
